# Patient Record
Sex: Male | Born: 1970 | Race: White | Hispanic: No | Marital: Married | Smoking: Never smoker
Health system: Southern US, Community
[De-identification: ages and names within clinical notes are randomized; demographics above are authoritative.]

## PROBLEM LIST (undated history)

## (undated) DIAGNOSIS — T753XXA Motion sickness, initial encounter: Secondary | ICD-10-CM

---

## 2013-08-29 ENCOUNTER — Encounter (INDEPENDENT_AMBULATORY_CARE_PROVIDER_SITE_OTHER): Payer: BC Managed Care – PPO | Admitting: Ophthalmology

## 2013-08-29 DIAGNOSIS — H43819 Vitreous degeneration, unspecified eye: Secondary | ICD-10-CM

## 2013-08-29 DIAGNOSIS — Q143 Congenital malformation of choroid: Secondary | ICD-10-CM

## 2015-07-20 ENCOUNTER — Encounter: Payer: Self-pay | Admitting: Anesthesiology

## 2015-07-23 NOTE — Discharge Instructions (Signed)
[facility] °[facility] °ENDOSCOPIC SINUS SURGERY °[facility] ° °What is Functional Endoscopic Sinus Surgery? ° The Surgery involves making the natural openings of the sinuses larger by removing the bony partitions that separate the sinuses from the nasal cavity.  The natural sinus lining is preserved as much as possible to allow the sinuses to resume normal function after the surgery.  In some patients nasal polyps (excessively swollen lining of the sinuses) may be removed to relieve obstruction of the sinus openings.  The surgery is performed through the nose using lighted scopes, which eliminates the need for incisions on the face.  A septoplasty is a different procedure which is sometimes performed with sinus surgery.  It involves straightening the boy partition that separates the two sides of your nose.  A crooked or deviated septum may need repair if is obstructing the sinuses or nasal airflow.  Turbinate reduction is also often performed during sinus surgery.  The turbinates are bony proturberances from the side walls of the nose which swell and can obstruct the nose in patients with sinus and allergy problems.  Their size can be surgically reduced to help relieve nasal obstruction. ° °What Can Sinus Surgery Do For Me? ° Sinus surgery can reduce the frequency of sinus infections requiring antibiotic treatment.  This can provide improvement in nasal congestion, post-nasal drainage, facial pressure and nasal obstruction.  Surgery will NOT prevent you from ever having an infection again, so it usually only for patients who get infections 4 or more times yearly requiring antibiotics, or for infections that do not clear with antibiotics.  It will not cure nasal allergies, so patients with allergies may still require medication to treat their allergies after surgery. Surgery may improve headaches related to sinusitis, however, some people will continue to  require medication to control sinus headaches related to allergies.  Surgery will do nothing for other forms of headache (migraine, tension or cluster). ° °What Are the Risks of Endoscopic Sinus Surgery? ° Current techniques allow surgery to be performed safely with little risk, however, there are rare complications that patients should be aware of.  Because the sinuses are located around the eyes, there is risk of eye injury, including blindness, though again, this would be quite rare. This is usually a result of bleeding behind the eye during surgery, which puts the vision oat risk, though there are treatments to protect the vision and prevent permanent disrupted by surgery causing a leak of the spinal fluid that surrounds the brain.  More serious complications would include bleeding inside the brain cavity or damage to the brain.  Again, all of these complications are uncommon, and spinal fluid leaks can be safely managed surgically if they occur.  The most common complication of sinus surgery is bleeding from the nose, which may require packing or cauterization of the nose.  Continued sinus have polyps may experience recurrence of the polyps requiring revision surgery.  Alterations of sense of smell or injury to the tear ducts are also rare complications.  ° °What is the Surgery Like, and what is the Recovery? ° The Surgery usually takes a couple of hours to perform, and is usually performed under a general anesthetic (completely asleep).  Patients are usually discharged home after a couple of hours.  Sometimes during surgery it is necessary to pack the nose to control bleeding, and the packing is left in place for 24 - 48 hours, and removed by your surgeon.    If a septoplasty was performed during the procedure, there is often a splint placed which must be removed after 5-7 days.   °Discomfort: Pain is usually mild to moderate, and can be controlled by prescription pain medication or acetaminophen (Tylenol).   Aspirin, Ibuprofen (Advil, Motrin), or Naprosyn (Aleve) should be avoided, as they can cause increased bleeding.  Most patients feel sinus pressure like they have a bad head cold for several days.  Sleeping with your head elevated can help reduce swelling and facial pressure, as can ice packs over the face.  A humidifier may be helpful to keep the mucous and blood from drying in the nose.  ° °Diet: There are no specific diet restrictions, however, you should generally start with clear liquids and a light diet of bland foods because the anesthetic can cause some nausea.  Advance your diet depending on how your stomach feels.  Taking your pain medication with food will often help reduce stomach upset which pain medications can cause. ° °Nasal Saline Irrigation: It is important to remove blood clots and dried mucous from the nose as it is healing.  This is done by having you irrigate the nose at least 3 - 4 times daily with a salt water solution.  We recommend using NeilMed Sinus Rinse (available at the drug store).  Fill the squeeze bottle with the solution, bend over a sink, and insert the tip of the squeeze bottle into the nose ½ of an inch.  Point the tip of the squeeze bottle towards the inside corner of the eye on the same side your irrigating.  Squeeze the bottle and gently irrigate the nose.  If you bend forward as you do this, most of the fluid will flow back out of the nose, instead of down your throat.   The solution should be warm, near body temperature, when you irrigate.   Each time you irrigate, you should use a full squeeze bottle.  ° °Note that if you are instructed to use Nasal Steroid Sprays at any time after your surgery, irrigate with saline BEFORE using the steroid spray, so you do not wash it all out of the nose. °Another product, Nasal Saline Gel (such as AYR Nasal Saline Gel) can be applied in each nostril 3 - 4 times daily to moisture the nose and reduce scabbing or crusting. ° °Bleeding:   Bloody drainage from the nose can be expected for several days, and patients are instructed to irrigate their nose frequently with salt water to help remove mucous and blood clots.  The drainage may be dark red or brown, though some fresh blood may be seen intermittently, especially after irrigation.  Do not blow you nose, as bleeding may occur. If you must sneeze, keep your mouth open to allow air to escape through your mouth. ° °If heavy bleeding occurs: Irrigate the nose with saline to rinse out clots, then spray the nose 3 - 4 times with Afrin Nasal Decongestant Spray.  The spray will constrict the blood vessels to slow bleeding.  Pinch the lower half of your nose shut to apply pressure, and lay down with your head elevated.  Ice packs over the nose may help as well. If bleeding persists despite these measures, you should notify your doctor.  Do not use the Afrin routinely to control nasal congestion after surgery, as it can result in worsening congestion and may affect healing.  ° ° ° °Activity: Return to work varies among patients. Most patients will be   out of work at least 5 - 7 days to recover.  Patient may return to work after they are off of narcotic pain medication, and feeling well enough to perform the functions of their job.  Patients must avoid heavy lifting (over 10 pounds) or strenuous physical for 2 weeks after surgery, so your employer may need to assign you to light duty, or keep you out of work longer if light duty is not possible.  NOTE: you should not drive, operate dangerous machinery, do any mentally demanding tasks or make any important legal or financial decisions while on narcotic pain medication and recovering from the general anesthetic.  °  °Call Your Doctor Immediately if You Have Any of the Following: °1. Bleeding that you cannot control with the above measures °2. Loss of vision, double vision, bulging of the eye or black eyes. °3. Fever over 101 degrees °4. Neck stiffness with  severe headache, fever, nausea and change in mental state. °You are always encourage to call anytime with concerns, however, please call with requests for pain medication refills during office hours. ° °Office Endoscopy: During follow-up visits your doctor will remove any packing or splints that may have been placed and evaluate and clean your sinuses endoscopically.  Topical anesthetic will be used to make this as comfortable as possible, though you may want to take your pain medication prior to the visit.  How often this will need to be done varies from patient to patient.  After complete recovery from the surgery, you may need follow-up endoscopy from time to time, particularly if there is concern of recurrent infection or nasal polyps. ° °General Anesthesia, Adult, Care After °Refer to this sheet in the next few weeks. These instructions provide you with information on caring for yourself after your procedure. Your health care provider may also give you more specific instructions. Your treatment has been planned according to current medical practices, but problems sometimes occur. Call your health care provider if you have any problems or questions after your procedure. °WHAT TO EXPECT AFTER THE PROCEDURE °After the procedure, it is typical to experience: °· Sleepiness. °· Nausea and vomiting. °HOME CARE INSTRUCTIONS °· For the first 24 hours after general anesthesia: °¨ Have a responsible person with you. °¨ Do not drive a car. If you are alone, do not take public transportation. °¨ Do not drink alcohol. °¨ Do not take medicine that has not been prescribed by your health care provider. °¨ Do not sign important papers or make important decisions. °¨ You may resume a normal diet and activities as directed by your health care provider. °· Change bandages (dressings) as directed. °· If you have questions or problems that seem related to general anesthesia, call the hospital and ask for the anesthetist or  anesthesiologist on call. °SEEK MEDICAL CARE IF: °· You have nausea and vomiting that continue the day after anesthesia. °· You develop a rash. °SEEK IMMEDIATE MEDICAL CARE IF:  °· You have difficulty breathing. °· You have chest pain. °· You have any allergic problems. °  °This information is not intended to replace advice given to you by your health care provider. Make sure you discuss any questions you have with your health care provider. °  °Document Released: 12/12/2000 Document Revised: 09/26/2014 Document Reviewed: 01/04/2012 °Elsevier Interactive Patient Education ©2016 [employer] ° °

## 2015-07-24 ENCOUNTER — Ambulatory Visit
Admission: RE | Admit: 2015-07-24 | Discharge: 2015-07-24 | Disposition: A | Discharge status: Home / Self Care | Payer: BLUE CROSS/BLUE SHIELD | Source: Ambulatory Visit | Attending: Unknown Physician Specialty | Admitting: Unknown Physician Specialty

## 2015-07-24 ENCOUNTER — Ambulatory Visit: Payer: BLUE CROSS/BLUE SHIELD | Admitting: Anesthesiology

## 2015-07-24 ENCOUNTER — Encounter: Payer: Self-pay | Admitting: *Deleted

## 2015-07-24 ENCOUNTER — Encounter
Admission: RE | Disposition: A | Discharge status: Home / Self Care | Payer: Self-pay | Source: Ambulatory Visit | Attending: Unknown Physician Specialty

## 2015-07-24 DIAGNOSIS — J3489 Other specified disorders of nose and nasal sinuses: Secondary | ICD-10-CM | POA: Diagnosis present

## 2015-07-24 DIAGNOSIS — J342 Deviated nasal septum: Secondary | ICD-10-CM | POA: Insufficient documentation

## 2015-07-24 DIAGNOSIS — J343 Hypertrophy of nasal turbinates: Secondary | ICD-10-CM | POA: Insufficient documentation

## 2015-07-24 HISTORY — DX: Motion sickness, initial encounter: T75.3XXA

## 2015-07-24 HISTORY — PX: NASAL SEPTOPLASTY W/ TURBINOPLASTY: SHX2070

## 2015-07-24 SURGERY — SEPTOPLASTY, NOSE, WITH NASAL TURBINATE REDUCTION
Anesthesia: General | Laterality: Bilateral | Wound class: Clean Contaminated

## 2015-07-24 MED ORDER — SCOPOLAMINE 1 MG/3DAYS TD PT72
1.0000 | MEDICATED_PATCH | TRANSDERMAL | Status: DC
  Administered 2015-07-24: 1.5 mg via TRANSDERMAL

## 2015-07-24 MED ORDER — OXYCODONE HCL 5 MG PO TABS
5.0000 mg | ORAL_TABLET | Freq: Once | ORAL | Status: AC | PRN
  Administered 2015-07-24: 5 mg via ORAL

## 2015-07-24 MED ORDER — LIDOCAINE-EPINEPHRINE 1 %-1:100000 IJ SOLN
INTRAMUSCULAR | Status: DC | PRN
  Administered 2015-07-24: 12 mL

## 2015-07-24 MED ORDER — LACTATED RINGERS IV SOLN
INTRAVENOUS | Status: DC
  Administered 2015-07-24: 08:00:00 via INTRAVENOUS

## 2015-07-24 MED ORDER — MIDAZOLAM HCL 5 MG/5ML IJ SOLN
INTRAMUSCULAR | Status: DC | PRN
  Administered 2015-07-24: 2 mg via INTRAVENOUS

## 2015-07-24 MED ORDER — OXYMETAZOLINE HCL 0.05 % NA SOLN
6.0000 | Freq: Once | NASAL | Status: AC
  Administered 2015-07-24: 6 via NASAL

## 2015-07-24 MED ORDER — DEXAMETHASONE SODIUM PHOSPHATE 4 MG/ML IJ SOLN
INTRAMUSCULAR | Status: DC | PRN
  Administered 2015-07-24: 8 mg via INTRAVENOUS

## 2015-07-24 MED ORDER — MEPERIDINE HCL 25 MG/ML IJ SOLN: 6.2500 mg | INTRAMUSCULAR | Status: DC | PRN

## 2015-07-24 MED ORDER — ONDANSETRON HCL 4 MG/2ML IJ SOLN
INTRAMUSCULAR | Status: DC | PRN
  Administered 2015-07-24: 4 mg via INTRAVENOUS

## 2015-07-24 MED ORDER — PROMETHAZINE HCL 25 MG/ML IJ SOLN: 6.2500 mg | INTRAMUSCULAR | Status: DC | PRN

## 2015-07-24 MED ORDER — HYDROMORPHONE HCL 1 MG/ML IJ SOLN
0.2500 mg | INTRAMUSCULAR | Status: DC | PRN
  Administered 2015-07-24: 0.25 mg via INTRAVENOUS

## 2015-07-24 MED ORDER — SUCCINYLCHOLINE CHLORIDE 20 MG/ML IJ SOLN
INTRAMUSCULAR | Status: DC | PRN
  Administered 2015-07-24: 100 mg via INTRAVENOUS

## 2015-07-24 MED ORDER — PROPOFOL 10 MG/ML IV BOLUS
INTRAVENOUS | Status: DC | PRN
  Administered 2015-07-24: 200 mg via INTRAVENOUS

## 2015-07-24 MED ORDER — HYDROCODONE-ACETAMINOPHEN 5-300 MG PO TABS: 1.0000 | ORAL_TABLET | ORAL | Status: AC | PRN

## 2015-07-24 MED ORDER — LIDOCAINE HCL (CARDIAC) 20 MG/ML IV SOLN
INTRAVENOUS | Status: DC | PRN
  Administered 2015-07-24: 40 mg via INTRAVENOUS

## 2015-07-24 MED ORDER — OXYCODONE HCL 5 MG/5ML PO SOLN: 5.0000 mg | Freq: Once | ORAL | Status: AC | PRN

## 2015-07-24 MED ORDER — FENTANYL CITRATE (PF) 100 MCG/2ML IJ SOLN
INTRAMUSCULAR | Status: DC | PRN
  Administered 2015-07-24: 100 ug via INTRAVENOUS

## 2015-07-24 MED ORDER — PHENYLEPHRINE HCL 0.5 % NA SOLN
NASAL | Status: DC | PRN
  Administered 2015-07-24: 15 mL via TOPICAL

## 2015-07-24 MED ORDER — SULFAMETHOXAZOLE-TRIMETHOPRIM 400-80 MG PO TABS: 1.0000 | ORAL_TABLET | Freq: Two times a day (BID) | ORAL | Status: AC

## 2015-07-24 SURGICAL SUPPLY — 29 items
BLADE SURG 15 STRL LF DISP TIS (BLADE) IMPLANT
BLADE SURG 15 STRL SS (BLADE)
COAG SUCT 10F 3.5MM HAND CTRL (MISCELLANEOUS) ×3 IMPLANT
DRAPE HEAD BAR (DRAPES) ×3 IMPLANT
DRESSING NASL FOAM PST OP SINU (MISCELLANEOUS) ×2 IMPLANT
DRSG NASAL FOAM POST OP SINU (MISCELLANEOUS) ×6
GLOVE BIO SURGEON STRL SZ7.5 (GLOVE) ×6 IMPLANT
HANDLE YANKAUER SUCT BULB TIP (MISCELLANEOUS) ×3 IMPLANT
NEEDLE HYPO 25GX1X1/2 BEV (NEEDLE) ×3 IMPLANT
NS IRRIG 500ML POUR BTL (IV SOLUTION) ×3 IMPLANT
PACK DRAPE NASAL/ENT (PACKS) ×3 IMPLANT
PAD GROUND ADULT SPLIT (MISCELLANEOUS) ×3 IMPLANT
SOL ANTI-FOG 6CC FOG-OUT (MISCELLANEOUS) ×1 IMPLANT
SOL FOG-OUT ANTI-FOG 6CC (MISCELLANEOUS) ×2
SPLINT NASAL SEPTAL BLV .25 LG (MISCELLANEOUS) ×3 IMPLANT
SPLINT NASAL SEPTAL BLV .50 ST (MISCELLANEOUS) ×3 IMPLANT
SPONGE NEURO XRAY DETECT 1X3 (DISPOSABLE) ×3 IMPLANT
STRAP BODY AND KNEE 60X3 (MISCELLANEOUS) ×3 IMPLANT
SUT CHROMIC 3-0 (SUTURE) ×2
SUT CHROMIC 3-0 KS 27XMFL CR (SUTURE) ×1
SUT CHROMIC 5-0 (SUTURE)
SUT CHROMIC 5-0 P2 18XMFL CR (SUTURE)
SUT ETHILON 3-0 KS 30 BLK (SUTURE) ×3 IMPLANT
SUT PLAIN GUT 4-0 (SUTURE) IMPLANT
SUTURE CHRMC 3-0 KS 27XMFL CR (SUTURE) ×1 IMPLANT
SUTURE CHRMC 5-0 P2 18XMF CR (SUTURE) IMPLANT
SYRINGE 10CC LL (SYRINGE) ×3 IMPLANT
TOWEL OR 17X26 4PK STRL BLUE (TOWEL DISPOSABLE) ×3 IMPLANT
WATER STERILE IRR 500ML POUR (IV SOLUTION) ×3 IMPLANT

## 2015-07-24 NOTE — Op Note (Signed)
PREOPERATIVE DIAGNOSIS:  Chronic nasal obstruction.  POSTOPERATIVE DIAGNOSIS:  Chronic nasal obstruction.  SURGEON:  Davina Pokehapman T. Kunaal Walkins, M.D.  NAME OF PROCEDURE:  1. Nasal septoplasty. 2. Submucous resection of inferior turbinates.  OPERATIVE FINDINGS:  Severe nasal septal deformity, hypertrophy of the inferior turbinates.   DESCRIPTION OF THE PROCEDURE:  Kent Schmidt was identified in the holding area and taken to the operating room and placed in the supine position.  After general endotracheal anesthesia was induced, the table was turned 45 degrees and the patient was placed in a semi-Fowler position.  The nose was then topically anesthetized with Lidocaine, cotton pledgets were placed within each nostril. After approximately 5 minutes, this was removed at which time a local anesthetic of 1% Lidocaine 1:100,000 units of Epinephrine was used to inject the inferior turbinates in the nasal septum. A total of 12 ml was used. Examination of the nose showed a severe left nasal septal deformity and tremendous hypertrophied inferior turbinate.  Beginning on the right hand side a hemitransfixion incision was then created on the leading edge of the septum on the right.  A subperichondrial plane was elevated posteriorly on the left and taken back to the perpendicular plate of the ethmoid where subperiosteal plane was elevated posteriorly on the left. A large septal spur was identified on the left hand side impacting on the inferior turbinate.  An inferior rim of cartilage was removed anteriorly with care taken to leave an anterior strut to prevent nasal collapse. With this strut removed the perpendicular plate of the ethmoid was separated from the quadrangular cartilage. The large septal spur was removed.  The septum was then replaced in the midline. Reinspection through each nostril showed excellent reduction of the septal deformity. A left posterior inferior fenestration was then created to allow hematoma  drainage.  With the septoplasty completed, beginning on the left-hand side, a 15 blade was used to incise along the inferior edge of the inferior turbinate. A superior laterally based flap was then elevated. The underlying conchal bone of mucosa was excised using Knight scissors. The flap was then laid back over the turbinate stump and cauterized using suction cautery. In a similar fashion the submucous resection was performed on the right.  With the submucous resection completed bilaterally and no active bleeding, the hemitransfixion incision was then closed using two interrupted 3-0 chromic sutures.  Plastic nasal septal splints were placed within each nostril and affixed to the septum using a 3-0 nylon suture. Stammberger was then used beneath each inferior turbinate for hemostasis.    The patient tolerated the procedure well, was returned to anesthesia, extubated in the operating room, and taken to the recovery room in stable condition.    CULTURES:  None.  SPECIMENS:  None.  ESTIMATED BLOOD LOSS:  25 cc.  Kent Schmidt T  07/24/2015  10:01 AM

## 2015-07-24 NOTE — Transfer of Care (Signed)
Immediate Anesthesia Transfer of Care Note  Patient: Kent Schmidt  Procedure(s) Performed: Procedure(s): NASAL SEPTOPLASTY WITH TURBINATE REDUCTION (Bilateral)  Patient Location: PACU  Anesthesia Type: General  Level of Consciousness: awake, alert  and patient cooperative  Airway and Oxygen Therapy: Patient Spontanous Breathing and Patient connected to supplemental oxygen  Post-op Assessment: Post-op Vital signs reviewed, Patient's Cardiovascular Status Stable, Respiratory Function Stable, Patent Airway and No signs of Nausea or vomiting  Post-op Vital Signs: Reviewed and stable  Complications: No apparent anesthesia complications

## 2015-07-24 NOTE — Anesthesia Postprocedure Evaluation (Signed)
  Anesthesia Post-op Note  Patient: Kent Schmidt  Procedure(s) Performed: Procedure(s): NASAL SEPTOPLASTY WITH TURBINATE REDUCTION (Bilateral)  Anesthesia type:General  Patient location: PACU  Post pain: Pain level controlled  Post assessment: Post-op Vital signs reviewed, Patient's Cardiovascular Status Stable, Respiratory Function Stable, Patent Airway and No signs of Nausea or vomiting  Post vital signs: Reviewed and stable  Last Vitals:  Filed Vitals:   07/24/15 1050  BP:   Pulse: 50  Temp:   Resp: 13    Level of consciousness: awake, alert  and patient cooperative  Complications: No apparent anesthesia complications

## 2015-07-24 NOTE — Anesthesia Procedure Notes (Signed)
Procedure Name: Intubation Date/Time: 07/24/2015 9:23 AM Performed by: Andee PolesBUSH, Kent Yasui Pre-anesthesia Checklist: Patient identified, Emergency Drugs available, Suction available, Patient being monitored and Timeout performed Patient Re-evaluated:Patient Re-evaluated prior to inductionOxygen Delivery Method: Circle system utilized Preoxygenation: Pre-oxygenation with 100% oxygen Intubation Type: IV induction Ventilation: Mask ventilation without difficulty Laryngoscope Size: Mac and 3 Grade View: Grade I Tube type: Oral Rae Tube size: 7.5 mm Number of attempts: 1 Placement Confirmation: ETT inserted through vocal cords under direct vision,  positive ETCO2 and breath sounds checked- equal and bilateral Tube secured with: Tape Dental Injury: Teeth and Oropharynx as per pre-operative assessment

## 2015-07-24 NOTE — H&P (Signed)
  H+P  Reviewed and will be scanned in later. No changes noted. 

## 2015-07-24 NOTE — Anesthesia Preprocedure Evaluation (Signed)
Anesthesia Evaluation  Patient identified by MRN, date of birth, ID band Patient awake    Reviewed: Allergy & Precautions, NPO status , Patient's Chart, lab work & pertinent test results, reviewed documented beta blocker date and time   Airway Mallampati: I  TM Distance: >3 FB Neck ROM: Full    Dental no notable dental hx.    Pulmonary neg pulmonary ROS,    Pulmonary exam normal        Cardiovascular negative cardio ROS Normal cardiovascular exam     Neuro/Psych negative neurological ROS     GI/Hepatic negative GI ROS, Neg liver ROS,   Endo/Other  negative endocrine ROS  Renal/GU negative Renal ROS  negative genitourinary   Musculoskeletal negative musculoskeletal ROS (+)   Abdominal   Peds negative pediatric ROS (+)  Hematology negative hematology ROS (+)   Anesthesia Other Findings   Reproductive/Obstetrics                             Anesthesia Physical Anesthesia Plan  ASA: I  Anesthesia Plan: General   Post-op Pain Management:    Induction:   Airway Management Planned: Oral ETT  Additional Equipment:   Intra-op Plan:   Post-operative Plan:   Informed Consent: I have reviewed the patients History and Physical, chart, labs and discussed the procedure including the risks, benefits and alternatives for the proposed anesthesia with the patient or authorized representative who has indicated his/her understanding and acceptance.     Plan Discussed with: CRNA  Anesthesia Plan Comments:         Anesthesia Quick Evaluation

## 2015-07-27 ENCOUNTER — Encounter: Payer: Self-pay | Admitting: Unknown Physician Specialty

## 2016-02-09 ENCOUNTER — Other Ambulatory Visit: Payer: Self-pay | Admitting: Internal Medicine

## 2016-02-09 DIAGNOSIS — R7989 Other specified abnormal findings of blood chemistry: Secondary | ICD-10-CM

## 2016-02-18 ENCOUNTER — Encounter
Admission: RE | Admit: 2016-02-18 | Discharge: 2016-02-18 | Disposition: A | Discharge status: Home / Self Care | Payer: BLUE CROSS/BLUE SHIELD | Source: Ambulatory Visit | Attending: Internal Medicine | Admitting: Internal Medicine

## 2016-02-18 DIAGNOSIS — R7989 Other specified abnormal findings of blood chemistry: Secondary | ICD-10-CM | POA: Diagnosis not present

## 2016-02-18 MED ORDER — SODIUM IODIDE I-123 3.7 MBQ PO CAPS
163.6000 | ORAL_CAPSULE | Freq: Once | ORAL | Status: AC
  Administered 2016-02-18: 163.6 via ORAL

## 2016-02-19 ENCOUNTER — Encounter
Admission: RE | Admit: 2016-02-19 | Discharge: 2016-02-19 | Disposition: A | Discharge status: Home / Self Care | Payer: BLUE CROSS/BLUE SHIELD | Source: Ambulatory Visit | Attending: Internal Medicine | Admitting: Internal Medicine

## 2016-03-30 ENCOUNTER — Other Ambulatory Visit: Payer: Self-pay | Admitting: Internal Medicine

## 2016-03-30 DIAGNOSIS — E041 Nontoxic single thyroid nodule: Secondary | ICD-10-CM

## 2016-05-25 ENCOUNTER — Ambulatory Visit
Admission: RE | Admit: 2016-05-25 | Discharge: 2016-05-25 | Disposition: A | Discharge status: Home / Self Care | Payer: BLUE CROSS/BLUE SHIELD | Source: Ambulatory Visit | Attending: Internal Medicine | Admitting: Internal Medicine

## 2016-05-25 DIAGNOSIS — E041 Nontoxic single thyroid nodule: Secondary | ICD-10-CM | POA: Insufficient documentation

## 2016-05-25 MED ORDER — SODIUM IODIDE I 131 CAPSULE
31.3000 | Freq: Once | INTRAVENOUS | Status: AC | PRN
  Administered 2016-05-25: 31.3 via ORAL

## 2017-08-17 IMAGING — NM NM THYROID IMAGING W/ UPTAKE MULTI (4&24 HR)
1 series · 3 of 3 positions shown · non-contrast
Comparison: None.

CLINICAL DATA: Hyper Ferienhaus Erxleben that hyperthyroidism. TSH equal
0.34 on 11/04/2015 and 0.288 on 02/02/2016.

EXAM:
THYROID SCAN AND UPTAKE - 4 AND 24 HOURS
TECHNIQUE: Following oral administration of I 123 capsule, anterior planar
imaging was acquired at 24 hours. Thyroid uptake was calculated with
a thyroid probe at 4-6 hours and 24 hours.
RADIOPHARMACEUTICALS:  One hundred sixty-four uCi I -123

[Series 1000: (id) thyroid scan · 2.40mm/px · 3 of 3 slices shown]
[im 1/3]
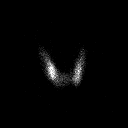
[im 2/3  full-range]
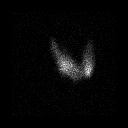
[im 3/3]
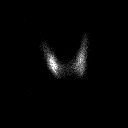

[3 of 3 positions shown; findings below may reference images not displayed]

FINDINGS: Uniform uptake within a normal-sized thyroid gland. There is a
region of increased uptake within the mid to lower pole of the RIGHT
lobe of the thyroid gland measuring approximately 1 cm..

6 hour I 123 uptake = 11.4% (normal 5-20%)

24 hour I 123 uptake = 14.7% (normal 10-30%)
IMPRESSION: 1. Potential warm nodule within the RIGHT lobe of thyroid gland.
Consider thyroid ultrasound for further evaluation.
2. Normal I 123 uptake.

## 2019-12-16 ENCOUNTER — Ambulatory Visit: Payer: BC Managed Care – PPO | Attending: Internal Medicine

## 2019-12-16 DIAGNOSIS — Z23 Encounter for immunization: Secondary | ICD-10-CM

## 2019-12-16 NOTE — Progress Notes (Signed)
   Covid-19 Vaccination Clinic  Name:  Bronislaus Verdell    MRN: [id number] DOB: 09/24/1970  12/16/2019  Mr. Marques was observed post Covid-19 immunization for 15 minutes without incident. He was provided with Vaccine Information Sheet and instruction to access the V-Safe system.   Mr. Klauer was instructed to call 911 with any severe reactions post vaccine: Marland Kitchen Difficulty breathing  . Swelling of face and throat  . A fast heartbeat  . A bad rash all over body  . Dizziness and weakness   Immunizations Administered    Name Date Dose VIS Date Route   Pfizer COVID-19 Vaccine 12/16/2019 11:53 AM 0.3 mL 08/30/2019 Intramuscular   Manufacturer: [employer], [employer]   Lot: [id number]   NDC: [address]-5

## 2020-01-06 ENCOUNTER — Ambulatory Visit: Payer: BC Managed Care – PPO | Attending: Internal Medicine

## 2020-01-06 DIAGNOSIS — Z23 Encounter for immunization: Secondary | ICD-10-CM

## 2020-01-06 NOTE — Progress Notes (Signed)
   Covid-19 Vaccination Clinic  Name:  Kent Schmidt    MRN: [id number] DOB: [birth year]  01/06/2020  [name] was observed post Covid-19 immunization for 15 minutes without incident. He was provided with Vaccine Information Sheet and instruction to access the V-Safe system.   [name] was instructed to call 911 with any severe reactions post vaccine: Marland Kitchen Difficulty breathing  . Swelling of face and throat  . A fast heartbeat  . A bad rash all over body  . Dizziness and weakness   Immunizations Administered    Name Date Dose VIS Date Route   Pfizer COVID-19 Vaccine 01/06/2020 10:14 AM 0.3 mL 11/13/2018 Intramuscular   Manufacturer: [employer], [employer]   Lot: [id number]   NDC: [address]-8

## 2020-01-13 ENCOUNTER — Ambulatory Visit: Payer: BC Managed Care – PPO | Attending: Internal Medicine
